# Patient Record
Sex: Female | Born: 1965 | Race: Asian | Hispanic: No | Marital: Married | Smoking: Never smoker
Health system: Southern US, Community
[De-identification: ages and names within clinical notes are randomized; demographics above are authoritative.]

---

## 1997-11-26 ENCOUNTER — Inpatient Hospital Stay (HOSPITAL_COMMUNITY): Admission: AD | Admit: 1997-11-26 | Discharge: 1997-11-28 | Payer: Self-pay | Admitting: Obstetrics and Gynecology

## 1999-12-22 ENCOUNTER — Other Ambulatory Visit: Admission: RE | Admit: 1999-12-22 | Discharge: 1999-12-22 | Payer: Self-pay | Admitting: Family Medicine

## 2001-03-25 ENCOUNTER — Other Ambulatory Visit: Admission: RE | Admit: 2001-03-25 | Discharge: 2001-03-25 | Payer: Self-pay | Admitting: *Deleted

## 2001-10-19 ENCOUNTER — Encounter: Payer: Self-pay | Admitting: Emergency Medicine

## 2001-10-19 ENCOUNTER — Emergency Department (HOSPITAL_COMMUNITY): Admission: EM | Admit: 2001-10-19 | Discharge: 2001-10-19 | Payer: Self-pay | Admitting: Emergency Medicine

## 2001-12-03 ENCOUNTER — Encounter: Admission: RE | Admit: 2001-12-03 | Discharge: 2001-12-03 | Payer: Self-pay | Admitting: Urology

## 2001-12-03 ENCOUNTER — Encounter: Payer: Self-pay | Admitting: Urology

## 2002-03-27 ENCOUNTER — Other Ambulatory Visit: Admission: RE | Admit: 2002-03-27 | Discharge: 2002-03-27 | Payer: Self-pay | Admitting: Family Medicine

## 2003-04-02 ENCOUNTER — Other Ambulatory Visit: Admission: RE | Admit: 2003-04-02 | Discharge: 2003-04-02 | Payer: Self-pay | Admitting: Family Medicine

## 2003-12-30 ENCOUNTER — Other Ambulatory Visit: Admission: RE | Admit: 2003-12-30 | Discharge: 2003-12-30 | Payer: Self-pay | Admitting: Family Medicine

## 2005-01-20 ENCOUNTER — Other Ambulatory Visit: Admission: RE | Admit: 2005-01-20 | Discharge: 2005-01-20 | Payer: Self-pay | Admitting: Family Medicine

## 2006-01-23 ENCOUNTER — Other Ambulatory Visit: Admission: RE | Admit: 2006-01-23 | Discharge: 2006-01-23 | Payer: Self-pay | Admitting: Family Medicine

## 2007-11-21 IMAGING — US MAMMO-RUNI-US
1 series · 14 of 16 positions shown · non-contrast
Comparison: NONE

CLINICAL DATA: Meyxana Tukezban RT(R)(M)(CT)   Diagnostic 
Mammogram.  

RIGHT BREAST MAMMOGRAM ADDITIONAL VIEWS AND RIGHT BREAST 
ULTRASOUND

[Series 1: us breast · 0.04mm/px · 14 of 16 slices shown]
[im 1/16]
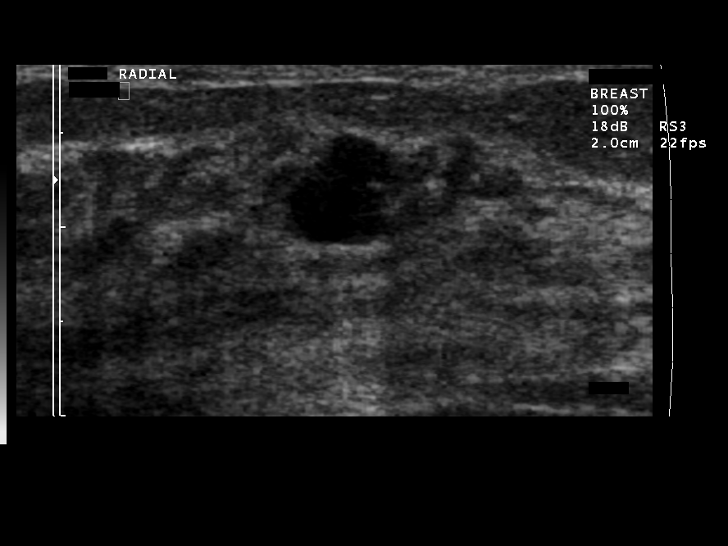
[im 2/16]
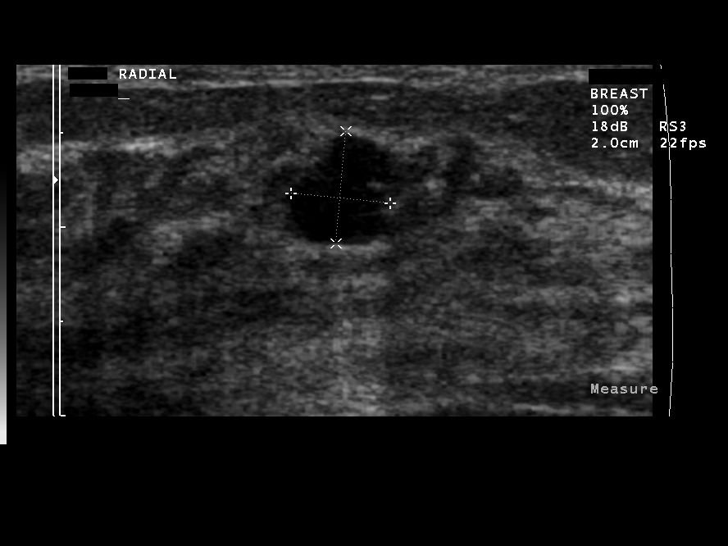
[im 3/16]
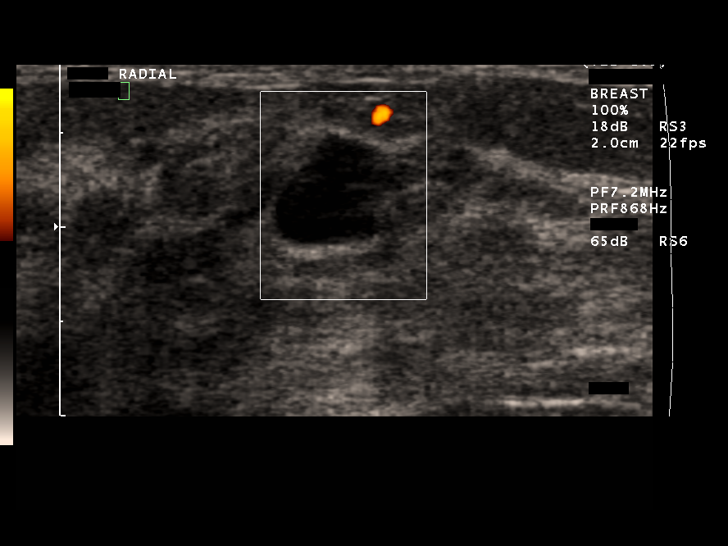
[im 5/16]
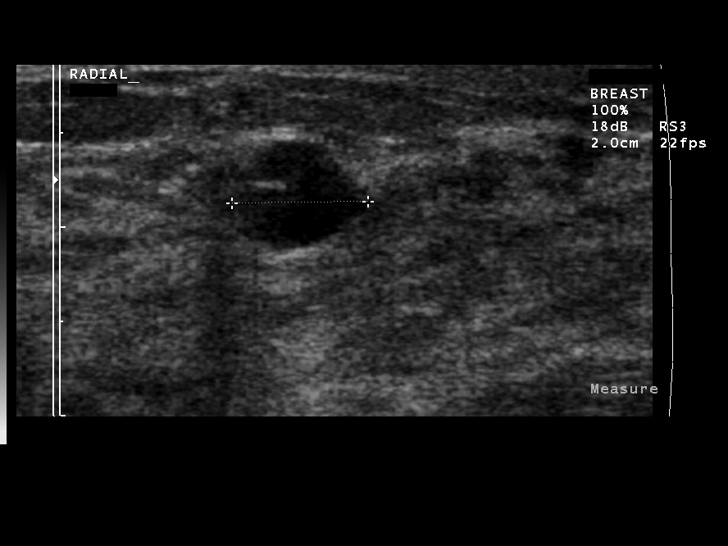
[im 6/16]
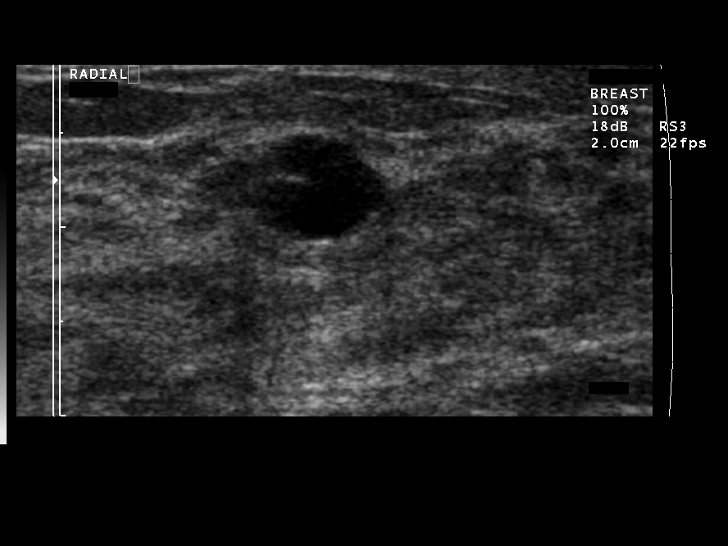
[im 7/16]
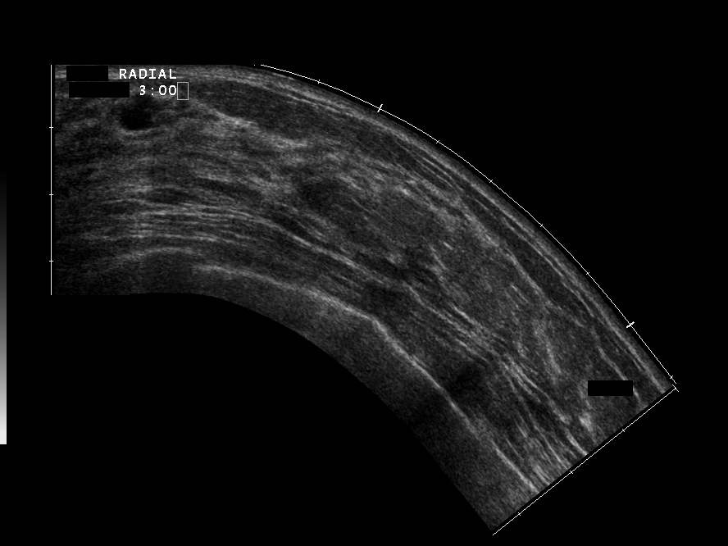
[im 8/16]
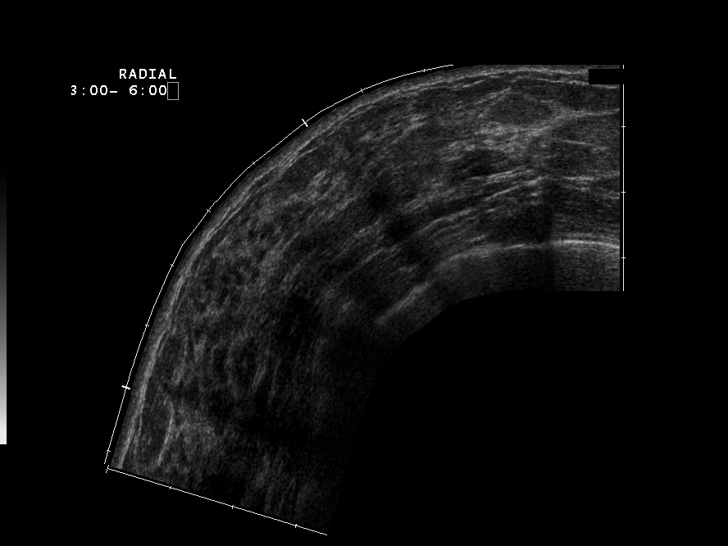
[im 9/16]
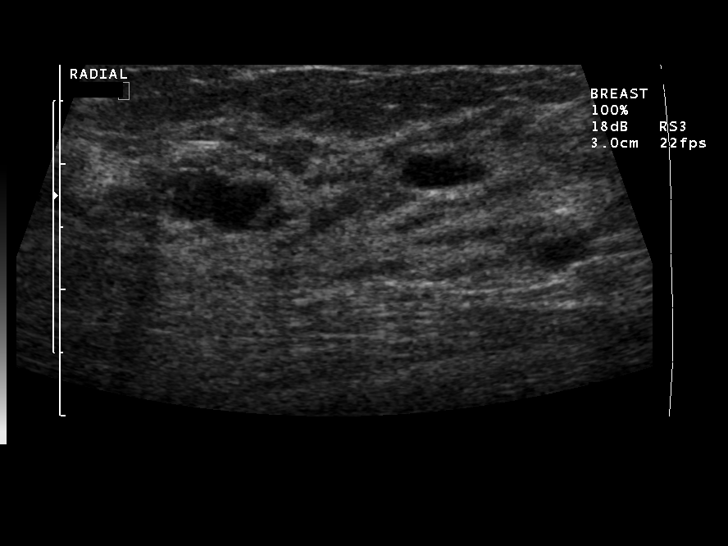
[im 10/16]
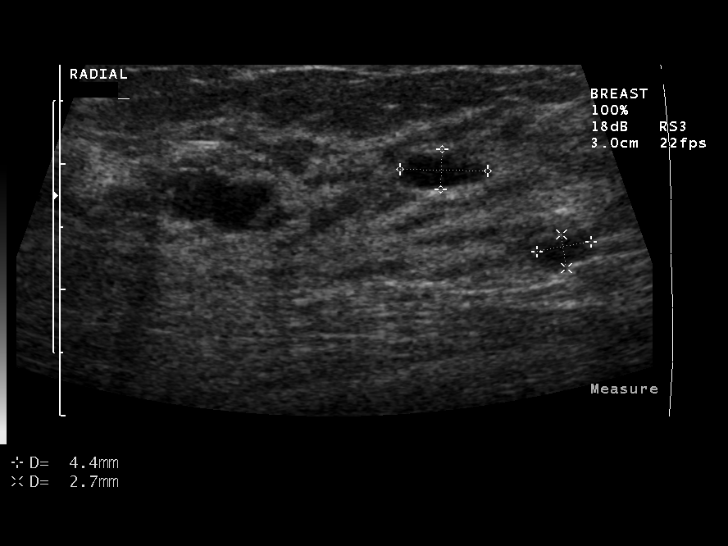
[im 11/16]
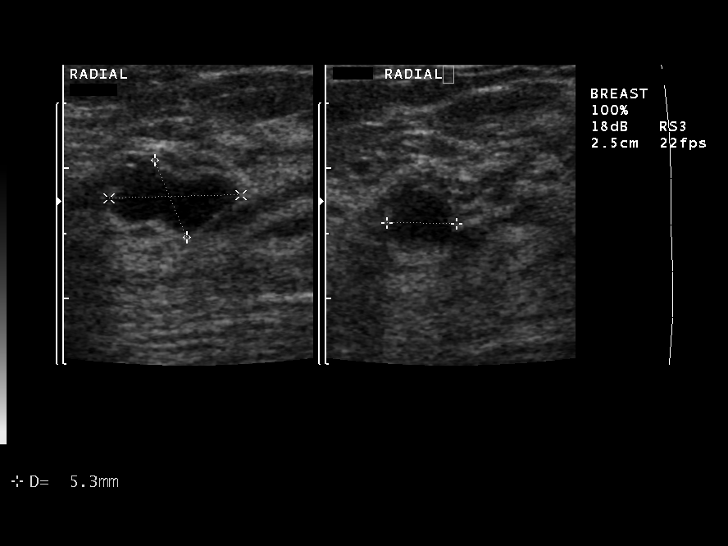
[im 13/16]
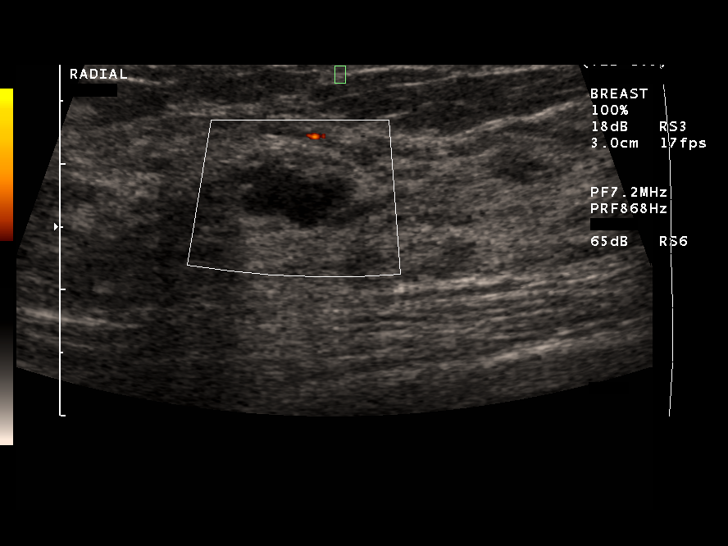
[im 14/16]
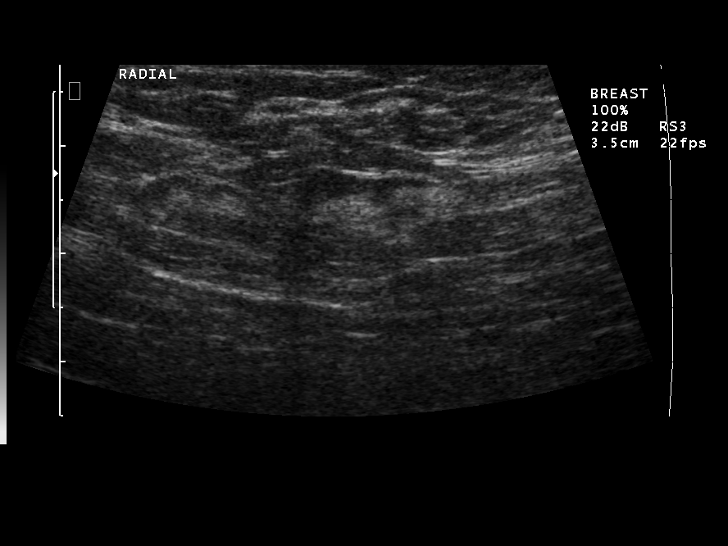
[im 15/16]
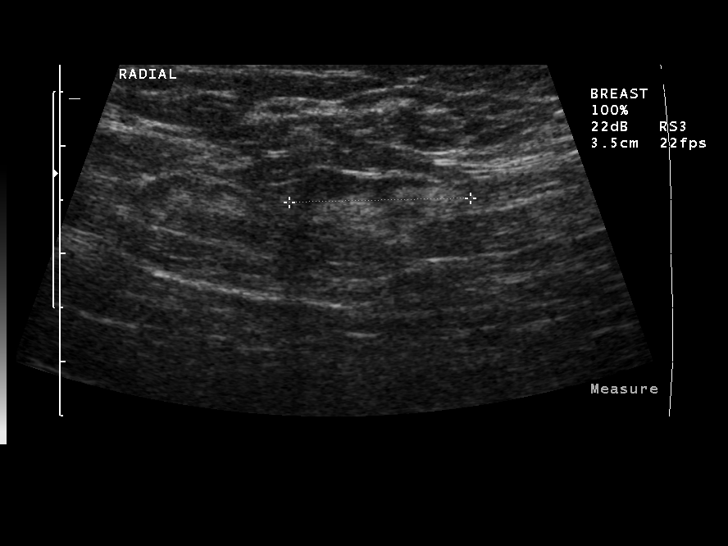
[im 16/16]
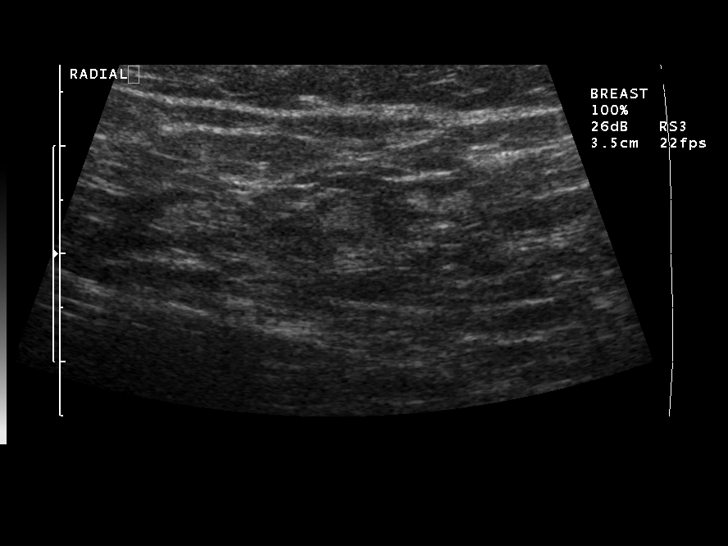

[14 of 16 positions shown; findings below may reference images not displayed]

FINDINGS: Spot compression of the right breast in the CC 
projection, exaggerated lateral right CC, and spot compression of 
the right breast in the MLO projection demonstrates benign 
fibroglandular asymmetry without definite architectural 
distortion, spiculation, mass, or microcalcification.  Ultrasound 
demonstrates benign appearing cystic changes in the [DATE] and 
[DATE] position.
IMPRESSION: Probable benign changes demonstrated 
mammographically and by ultrasound.  Recommend follow-up right 
breast mammogram and ultrasound in six-months to document 
stability. The patient was informed at the time of the examination 
of the findings and recommendations by verbal and written lay 
report. Computer assisted (Second Look) technology was used as an 
aid in interpretation of this study. BI-RADS 3 - Probably Benign 
Date: 04/04/2006  Tran Date: 04/05/2006 FELNER CEOLA

## 2008-10-08 IMAGING — US MAMMO-RUNI-US
1 series · 7 of 7 positions shown · non-contrast
Comparison: NONE

CLINICAL DATA: Jean Eloge Chartiel RT(R)(M)   Diagnostic Mammogram. 

BILATERAL MAMMOGRAM AND RIGHT BREAST ULTRASOUND

[Series 1: us breast · 0.05mm/px · 7 of 7 slices shown]
[im 1/7]
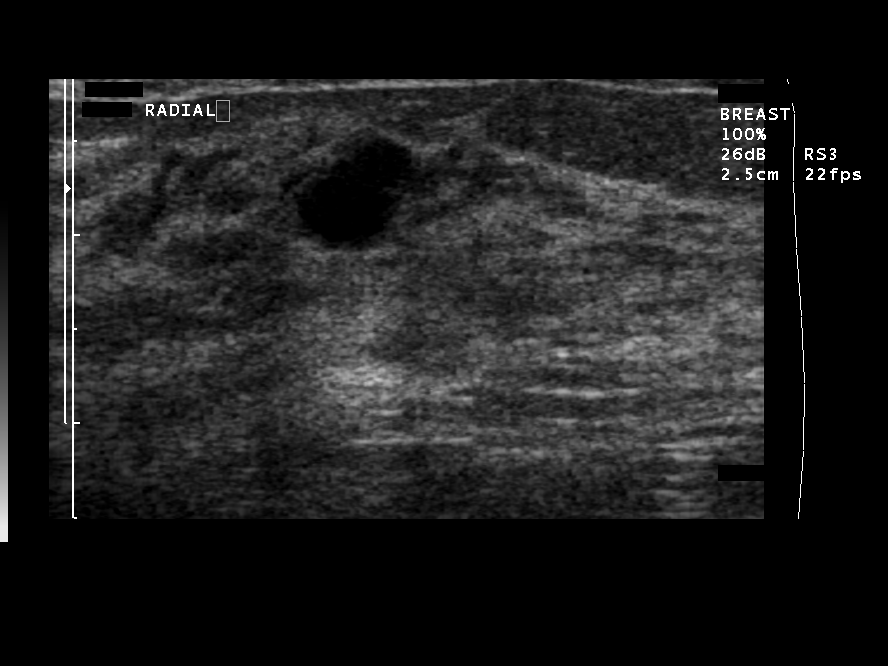
[im 2/7]
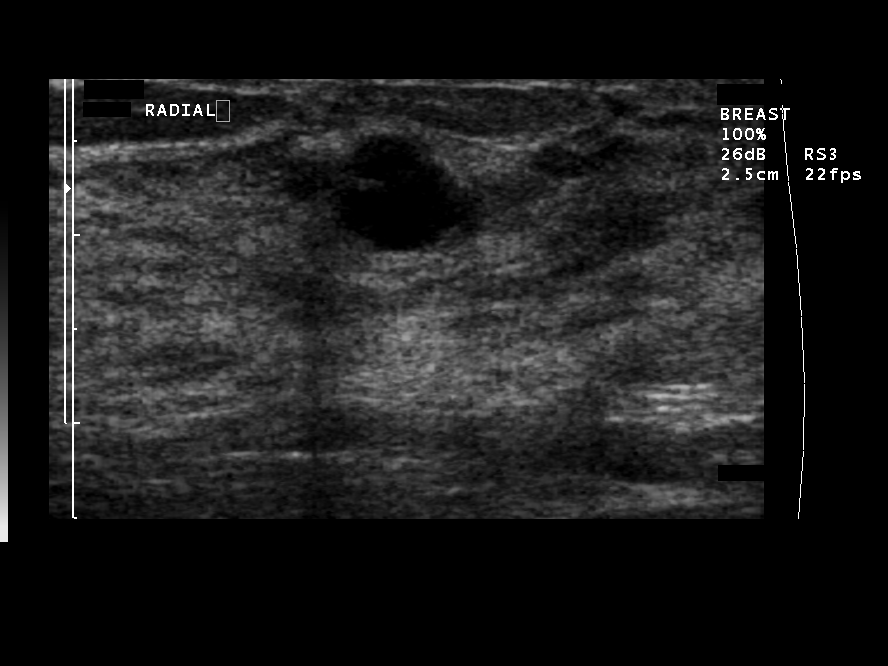
[im 3/7]
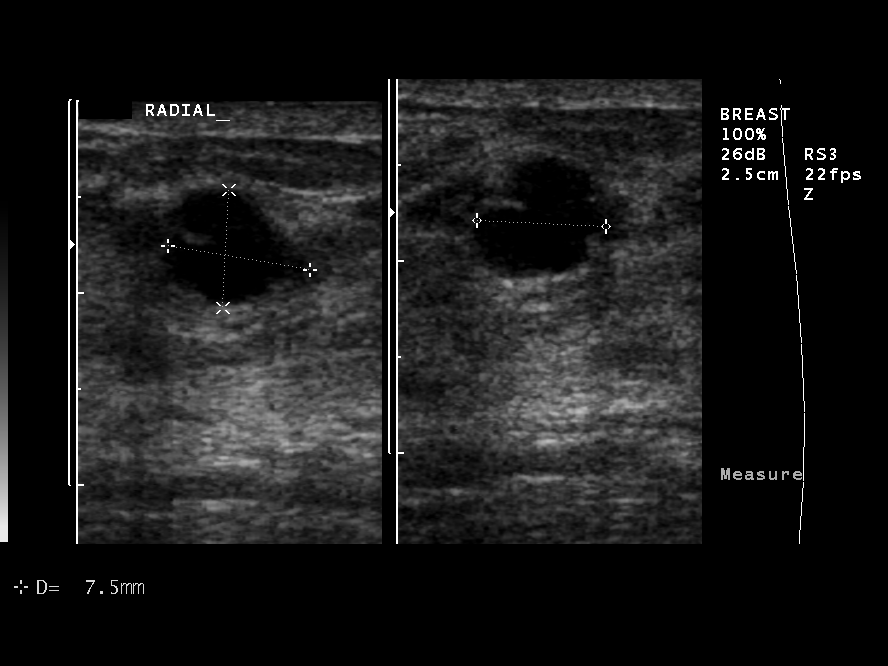
[im 4/7]
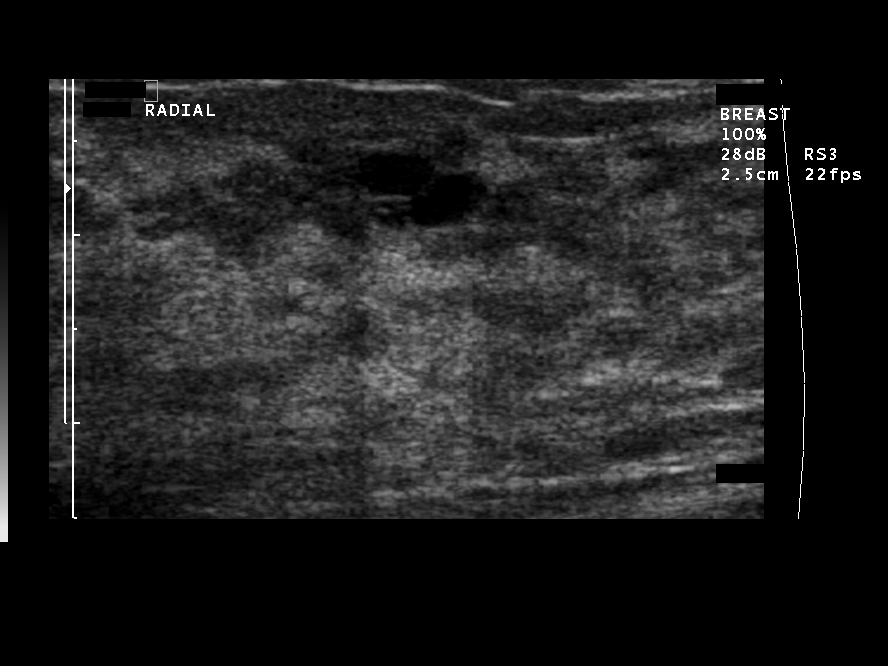
[im 5/7]
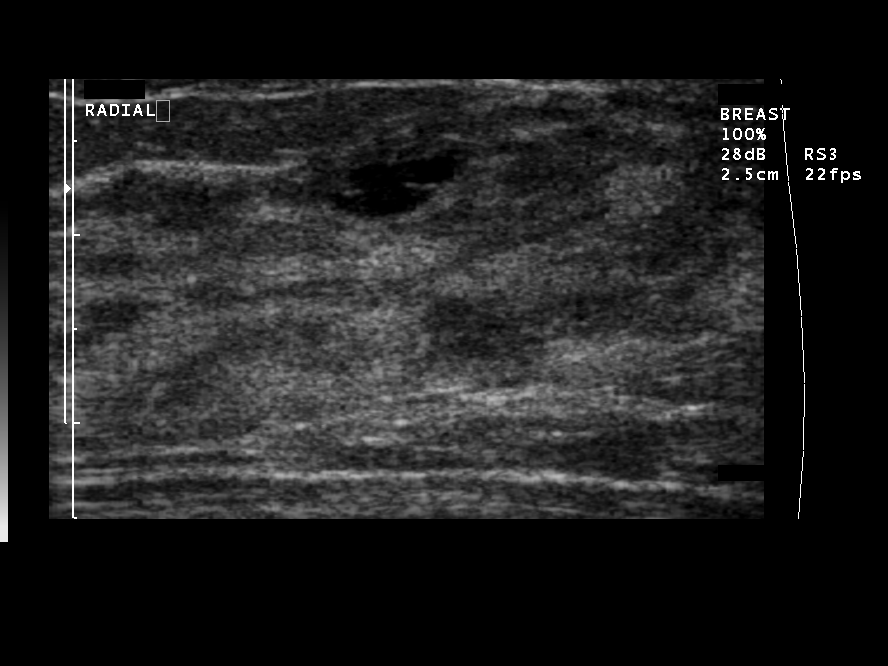
[im 6/7]
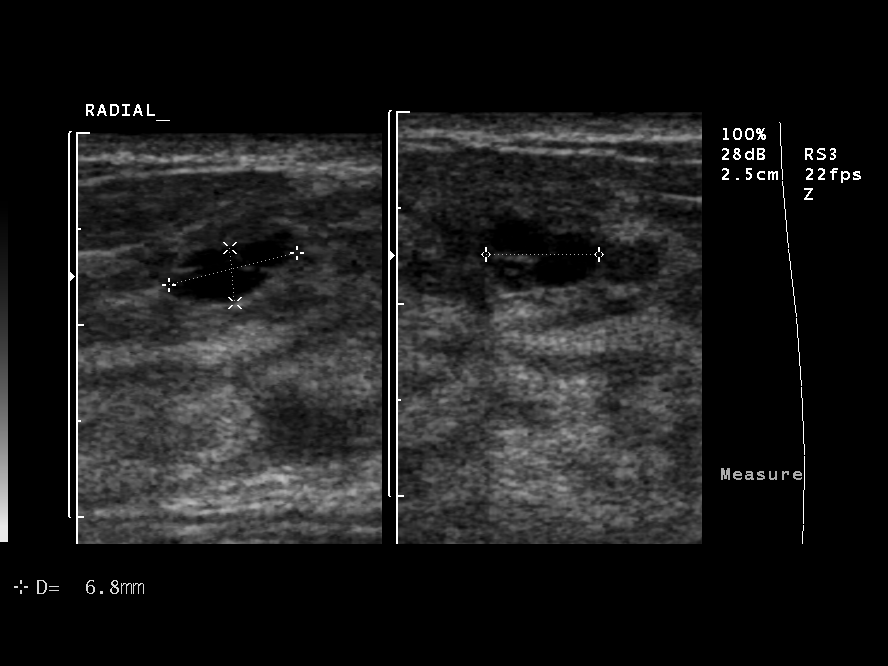
[im 7/7]
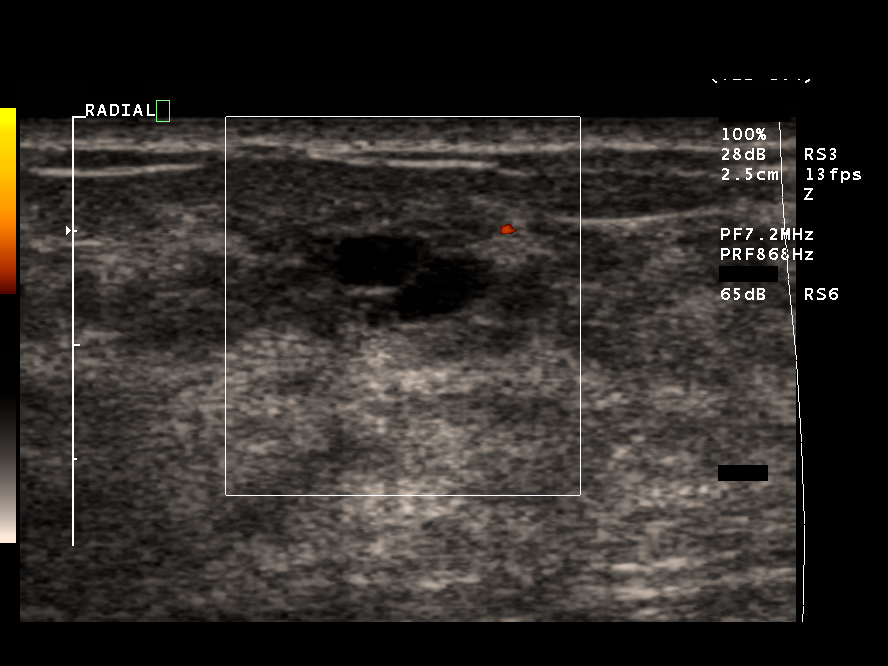

[7 of 7 positions shown; findings below may reference images not displayed]

FINDINGS: The patient was due to six-month follow-up of the 
right breast in [REDACTED] or [place]mu[place].  As the patient was close to her 
annual recheck date, bilateral mammogram was performed.  Compared 
with 06-07-01 and 03-20-06, dense glandular pattern is stable.  
There is no focal area of concern for malignancy in either breast. 
Re-evaluation of the right breast with ultrasound demonstrates a 
simple septated cyst which is stable in size and appearance.  
Compared with 04-04-06 study, this measures 0.8 x 0.6 x 0.7 cm.  
Noted in the 10 o???clock position of the right breast is a cyst 
which has diminished in size from 1.0 x 0.6 x 0.5 cm to 0.7 x
x 0.5 cm.  There are no suspicious features.
IMPRESSION: Bilateral mammogram without focal area of concern 
for malignancy. Right breast cyst identified. I have recommended 
annual screening mammography. The patient was informed at the time 
of the examination of the findings and recommendations by verbal 
and written lay report. Computer assisted (Second Look) technology 
was used as an aid in interpretation of this study. Mammogram:  
BI-RADS 1: Negative. Ultrasound:  BI-RADS 2- Benign Richard William Melo Quispe 
Gable, M.D. electronically reviewed on 02/21/2007 Dict Date: 
02/20/2007  Tran Date: 02/21/2007 CAV  F[place]ON

## 2010-03-10 ENCOUNTER — Other Ambulatory Visit
Admission: RE | Admit: 2010-03-10 | Discharge: 2010-03-10 | Payer: Self-pay | Source: Home / Self Care | Admitting: Family Medicine

## 2011-03-15 ENCOUNTER — Other Ambulatory Visit: Payer: Self-pay | Admitting: Family Medicine

## 2011-03-15 ENCOUNTER — Other Ambulatory Visit (HOSPITAL_COMMUNITY)
Admission: RE | Admit: 2011-03-15 | Discharge: 2011-03-15 | Disposition: A | Discharge status: Home / Self Care | Payer: BC Managed Care – PPO | Source: Ambulatory Visit | Attending: Family Medicine | Admitting: Family Medicine

## 2011-03-15 DIAGNOSIS — Z124 Encounter for screening for malignant neoplasm of cervix: Secondary | ICD-10-CM | POA: Insufficient documentation

## 2012-03-29 ENCOUNTER — Other Ambulatory Visit (HOSPITAL_COMMUNITY)
Admission: RE | Admit: 2012-03-29 | Discharge: 2012-03-29 | Disposition: A | Discharge status: Home / Self Care | Payer: BC Managed Care – PPO | Source: Ambulatory Visit | Attending: Family Medicine | Admitting: Family Medicine

## 2012-03-29 ENCOUNTER — Other Ambulatory Visit: Payer: Self-pay | Admitting: Family Medicine

## 2012-03-29 DIAGNOSIS — Z124 Encounter for screening for malignant neoplasm of cervix: Secondary | ICD-10-CM | POA: Insufficient documentation

## 2013-04-23 ENCOUNTER — Other Ambulatory Visit: Payer: Self-pay | Admitting: Family Medicine

## 2013-04-23 ENCOUNTER — Other Ambulatory Visit (HOSPITAL_COMMUNITY)
Admission: RE | Admit: 2013-04-23 | Discharge: 2013-04-23 | Disposition: A | Discharge status: Home / Self Care | Payer: BC Managed Care – PPO | Source: Ambulatory Visit | Attending: Family Medicine | Admitting: Family Medicine

## 2013-04-23 DIAGNOSIS — Z124 Encounter for screening for malignant neoplasm of cervix: Secondary | ICD-10-CM | POA: Insufficient documentation

## 2016-06-08 ENCOUNTER — Other Ambulatory Visit (HOSPITAL_COMMUNITY)
Admission: RE | Admit: 2016-06-08 | Discharge: 2016-06-08 | Disposition: A | Discharge status: Home / Self Care | Payer: BLUE CROSS/BLUE SHIELD | Source: Ambulatory Visit | Attending: Family Medicine | Admitting: Family Medicine

## 2016-06-08 ENCOUNTER — Other Ambulatory Visit: Payer: Self-pay | Admitting: Family Medicine

## 2016-06-08 DIAGNOSIS — E7800 Pure hypercholesterolemia, unspecified: Secondary | ICD-10-CM | POA: Diagnosis not present

## 2016-06-08 DIAGNOSIS — Z0000 Encounter for general adult medical examination without abnormal findings: Secondary | ICD-10-CM | POA: Diagnosis not present

## 2016-06-08 DIAGNOSIS — E559 Vitamin D deficiency, unspecified: Secondary | ICD-10-CM | POA: Diagnosis not present

## 2016-06-08 DIAGNOSIS — Z124 Encounter for screening for malignant neoplasm of cervix: Secondary | ICD-10-CM | POA: Insufficient documentation

## 2016-06-12 LAB — CYTOLOGY - PAP: Diagnosis: NEGATIVE

## 2016-06-19 DIAGNOSIS — D485 Neoplasm of uncertain behavior of skin: Secondary | ICD-10-CM | POA: Diagnosis not present

## 2016-06-19 DIAGNOSIS — D225 Melanocytic nevi of trunk: Secondary | ICD-10-CM | POA: Diagnosis not present

## 2016-06-19 DIAGNOSIS — L308 Other specified dermatitis: Secondary | ICD-10-CM | POA: Diagnosis not present

## 2016-07-17 ENCOUNTER — Other Ambulatory Visit: Payer: Self-pay | Admitting: Obstetrics and Gynecology

## 2016-07-17 DIAGNOSIS — N841 Polyp of cervix uteri: Secondary | ICD-10-CM | POA: Diagnosis not present

## 2016-07-17 DIAGNOSIS — Z3040 Encounter for surveillance of contraceptives, unspecified: Secondary | ICD-10-CM | POA: Diagnosis not present

## 2016-07-17 DIAGNOSIS — Z01419 Encounter for gynecological examination (general) (routine) without abnormal findings: Secondary | ICD-10-CM | POA: Diagnosis not present

## 2016-07-17 DIAGNOSIS — N644 Mastodynia: Secondary | ICD-10-CM | POA: Diagnosis not present

## 2016-08-10 DIAGNOSIS — N644 Mastodynia: Secondary | ICD-10-CM | POA: Diagnosis not present

## 2016-08-10 DIAGNOSIS — N6001 Solitary cyst of right breast: Secondary | ICD-10-CM | POA: Diagnosis not present

## 2017-03-30 DIAGNOSIS — K648 Other hemorrhoids: Secondary | ICD-10-CM | POA: Diagnosis not present

## 2017-03-30 DIAGNOSIS — Z1211 Encounter for screening for malignant neoplasm of colon: Secondary | ICD-10-CM | POA: Diagnosis not present

## 2017-06-21 DIAGNOSIS — Z0000 Encounter for general adult medical examination without abnormal findings: Secondary | ICD-10-CM | POA: Diagnosis not present

## 2017-06-21 DIAGNOSIS — M8588 Other specified disorders of bone density and structure, other site: Secondary | ICD-10-CM | POA: Diagnosis not present

## 2017-06-21 DIAGNOSIS — M25521 Pain in right elbow: Secondary | ICD-10-CM | POA: Diagnosis not present

## 2017-06-21 DIAGNOSIS — E7800 Pure hypercholesterolemia, unspecified: Secondary | ICD-10-CM | POA: Diagnosis not present

## 2017-06-21 DIAGNOSIS — E559 Vitamin D deficiency, unspecified: Secondary | ICD-10-CM | POA: Diagnosis not present

## 2017-07-19 DIAGNOSIS — Z8262 Family history of osteoporosis: Secondary | ICD-10-CM | POA: Diagnosis not present

## 2017-07-19 DIAGNOSIS — Z01419 Encounter for gynecological examination (general) (routine) without abnormal findings: Secondary | ICD-10-CM | POA: Diagnosis not present

## 2017-07-19 DIAGNOSIS — N921 Excessive and frequent menstruation with irregular cycle: Secondary | ICD-10-CM | POA: Diagnosis not present

## 2017-08-07 ENCOUNTER — Other Ambulatory Visit: Payer: Self-pay | Admitting: Obstetrics and Gynecology

## 2017-08-07 DIAGNOSIS — N921 Excessive and frequent menstruation with irregular cycle: Secondary | ICD-10-CM

## 2017-08-14 ENCOUNTER — Ambulatory Visit
Admission: RE | Admit: 2017-08-14 | Discharge: 2017-08-14 | Disposition: A | Discharge status: Home / Self Care | Payer: BLUE CROSS/BLUE SHIELD | Source: Ambulatory Visit | Attending: Obstetrics and Gynecology | Admitting: Obstetrics and Gynecology

## 2017-08-14 DIAGNOSIS — N926 Irregular menstruation, unspecified: Secondary | ICD-10-CM | POA: Diagnosis not present

## 2017-08-14 DIAGNOSIS — N921 Excessive and frequent menstruation with irregular cycle: Secondary | ICD-10-CM

## 2017-08-20 DIAGNOSIS — N921 Excessive and frequent menstruation with irregular cycle: Secondary | ICD-10-CM | POA: Diagnosis not present

## 2017-09-14 ENCOUNTER — Other Ambulatory Visit: Payer: Self-pay | Admitting: Obstetrics and Gynecology

## 2017-09-14 DIAGNOSIS — N8500 Endometrial hyperplasia, unspecified: Secondary | ICD-10-CM | POA: Diagnosis not present

## 2017-09-14 DIAGNOSIS — N921 Excessive and frequent menstruation with irregular cycle: Secondary | ICD-10-CM | POA: Diagnosis not present

## 2017-10-01 DIAGNOSIS — D259 Leiomyoma of uterus, unspecified: Secondary | ICD-10-CM | POA: Diagnosis not present

## 2017-10-01 DIAGNOSIS — N921 Excessive and frequent menstruation with irregular cycle: Secondary | ICD-10-CM | POA: Diagnosis not present

## 2017-11-16 DIAGNOSIS — Z1231 Encounter for screening mammogram for malignant neoplasm of breast: Secondary | ICD-10-CM | POA: Diagnosis not present

## 2017-11-16 DIAGNOSIS — M8589 Other specified disorders of bone density and structure, multiple sites: Secondary | ICD-10-CM | POA: Diagnosis not present

## 2018-01-02 DIAGNOSIS — N921 Excessive and frequent menstruation with irregular cycle: Secondary | ICD-10-CM | POA: Diagnosis not present

## 2018-01-02 DIAGNOSIS — D259 Leiomyoma of uterus, unspecified: Secondary | ICD-10-CM | POA: Diagnosis not present

## 2018-02-28 DIAGNOSIS — L308 Other specified dermatitis: Secondary | ICD-10-CM | POA: Diagnosis not present

## 2018-03-29 DIAGNOSIS — L814 Other melanin hyperpigmentation: Secondary | ICD-10-CM | POA: Diagnosis not present

## 2018-03-29 DIAGNOSIS — L308 Other specified dermatitis: Secondary | ICD-10-CM | POA: Diagnosis not present

## 2019-06-09 DIAGNOSIS — Z1231 Encounter for screening mammogram for malignant neoplasm of breast: Secondary | ICD-10-CM | POA: Diagnosis not present

## 2019-08-06 DIAGNOSIS — Z1321 Encounter for screening for nutritional disorder: Secondary | ICD-10-CM | POA: Diagnosis not present

## 2019-08-06 DIAGNOSIS — Z124 Encounter for screening for malignant neoplasm of cervix: Secondary | ICD-10-CM | POA: Diagnosis not present

## 2019-08-06 DIAGNOSIS — Z1151 Encounter for screening for human papillomavirus (HPV): Secondary | ICD-10-CM | POA: Diagnosis not present

## 2019-08-06 DIAGNOSIS — Z1329 Encounter for screening for other suspected endocrine disorder: Secondary | ICD-10-CM | POA: Diagnosis not present

## 2019-08-06 DIAGNOSIS — Z13220 Encounter for screening for lipoid disorders: Secondary | ICD-10-CM | POA: Diagnosis not present

## 2019-08-06 DIAGNOSIS — Z131 Encounter for screening for diabetes mellitus: Secondary | ICD-10-CM | POA: Diagnosis not present

## 2019-08-06 DIAGNOSIS — Z01419 Encounter for gynecological examination (general) (routine) without abnormal findings: Secondary | ICD-10-CM | POA: Diagnosis not present

## 2019-08-30 DIAGNOSIS — Z20828 Contact with and (suspected) exposure to other viral communicable diseases: Secondary | ICD-10-CM | POA: Diagnosis not present

## 2019-08-30 DIAGNOSIS — Z03818 Encounter for observation for suspected exposure to other biological agents ruled out: Secondary | ICD-10-CM | POA: Diagnosis not present

## 2019-09-24 DIAGNOSIS — Z13820 Encounter for screening for osteoporosis: Secondary | ICD-10-CM | POA: Diagnosis not present

## 2019-09-24 DIAGNOSIS — M85851 Other specified disorders of bone density and structure, right thigh: Secondary | ICD-10-CM | POA: Diagnosis not present

## 2019-09-24 DIAGNOSIS — M8580 Other specified disorders of bone density and structure, unspecified site: Secondary | ICD-10-CM | POA: Diagnosis not present

## 2019-09-25 DIAGNOSIS — Z713 Dietary counseling and surveillance: Secondary | ICD-10-CM | POA: Diagnosis not present

## 2019-10-14 DIAGNOSIS — E782 Mixed hyperlipidemia: Secondary | ICD-10-CM | POA: Diagnosis not present

## 2019-11-25 DIAGNOSIS — E782 Mixed hyperlipidemia: Secondary | ICD-10-CM | POA: Diagnosis not present

## 2019-12-03 DIAGNOSIS — Z9189 Other specified personal risk factors, not elsewhere classified: Secondary | ICD-10-CM | POA: Diagnosis not present

## 2019-12-03 DIAGNOSIS — Z8262 Family history of osteoporosis: Secondary | ICD-10-CM | POA: Diagnosis not present

## 2019-12-03 DIAGNOSIS — Z79899 Other long term (current) drug therapy: Secondary | ICD-10-CM | POA: Diagnosis not present

## 2020-01-13 DIAGNOSIS — E782 Mixed hyperlipidemia: Secondary | ICD-10-CM | POA: Diagnosis not present

## 2020-01-13 IMAGING — US US PELVIS COMPLETE TRANSABD/TRANSVAG
1 series · 14 of 25 positions shown · non-contrast
Comparison: None

CLINICAL DATA: Irregular menses.



[Series 1: us pelvis complete transabd/transvag · 0.22mm/px · 14 of 42 slices shown]
[im 1/42]
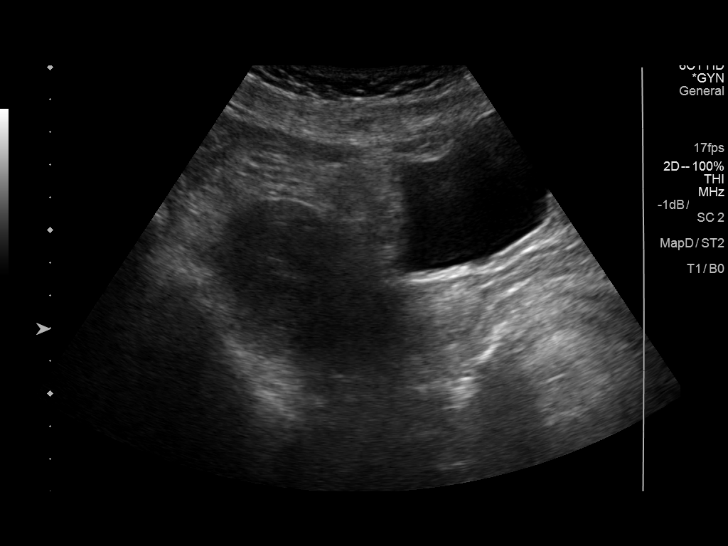
[im 4/42]
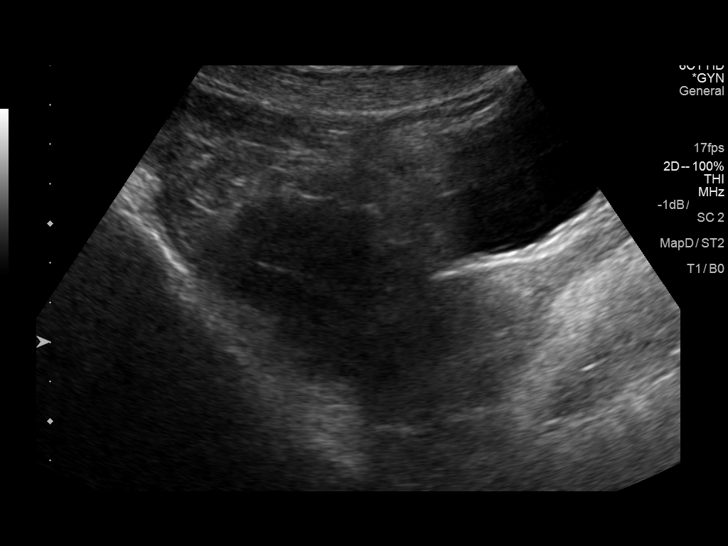
[im 7/42]
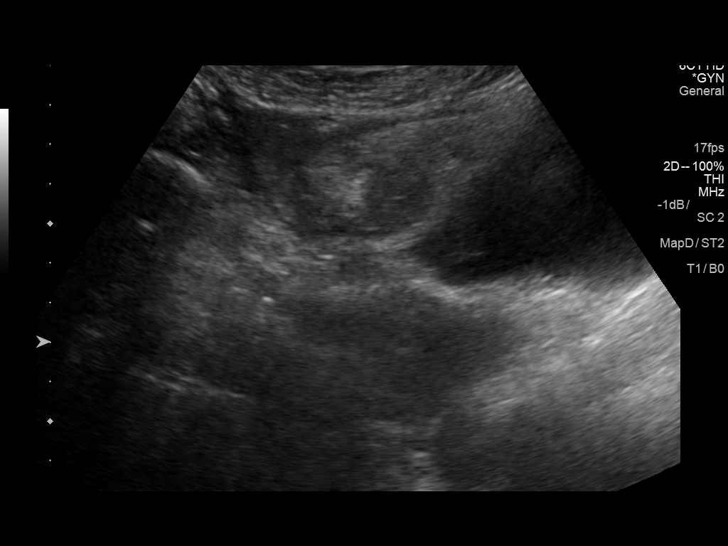
[im 11/42]
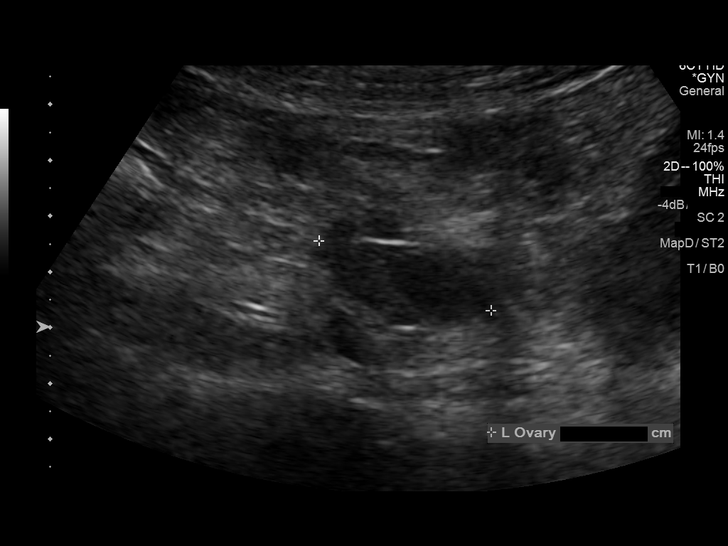
[im 14/42]
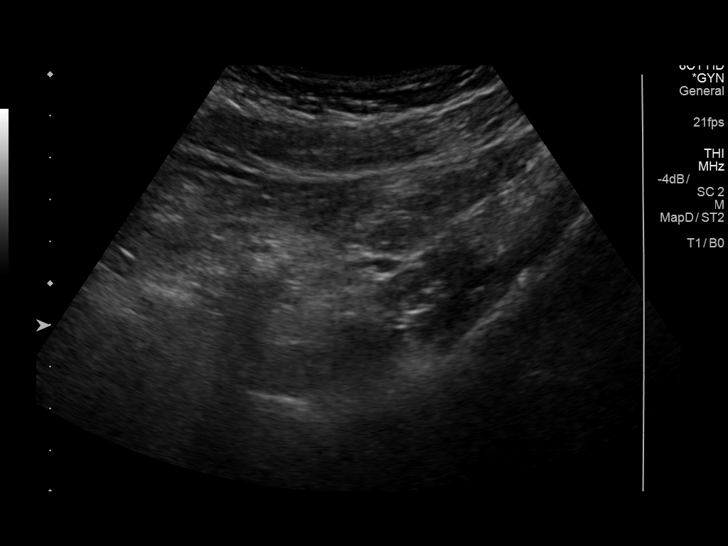
[im 16/42]
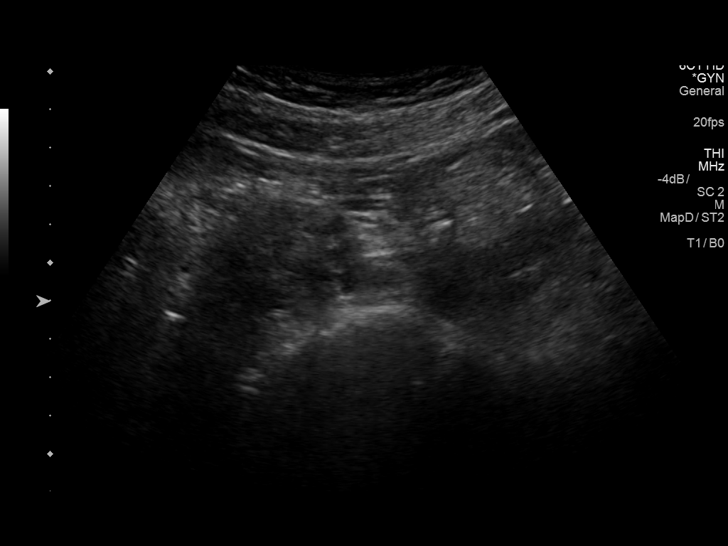
[im 19/42]
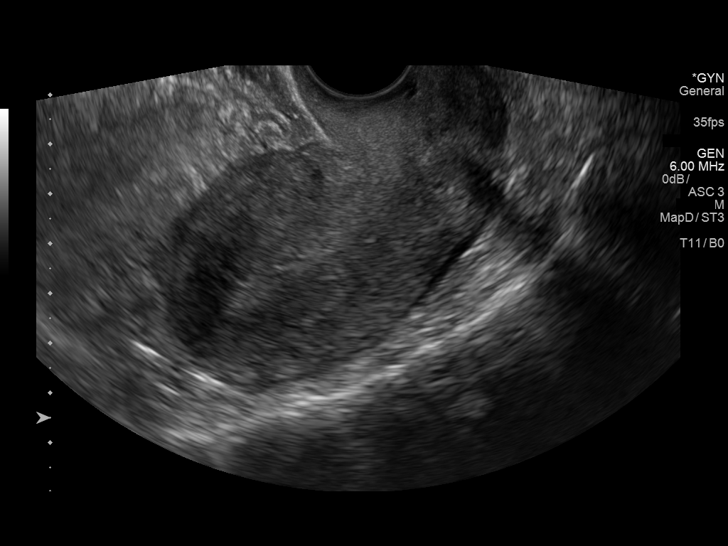
[im 23/42]
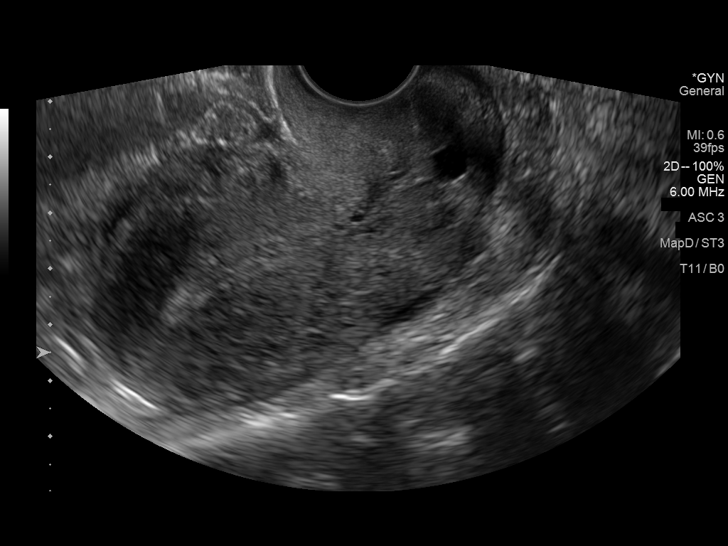
[im 26/42]
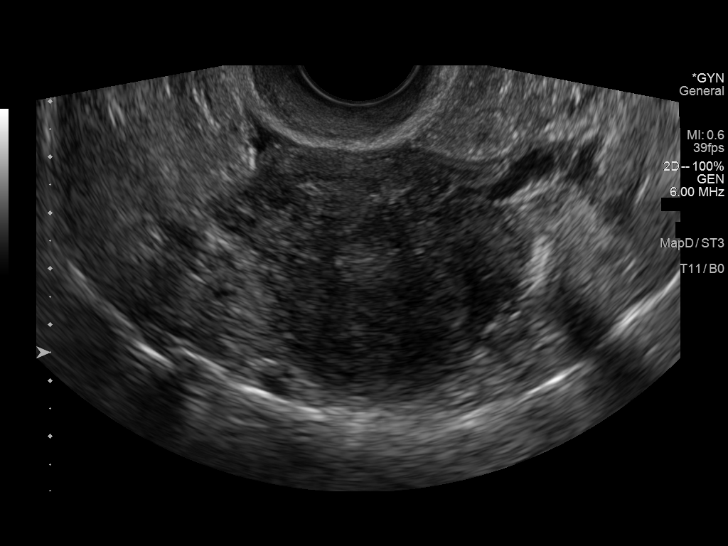
[im 28/42]
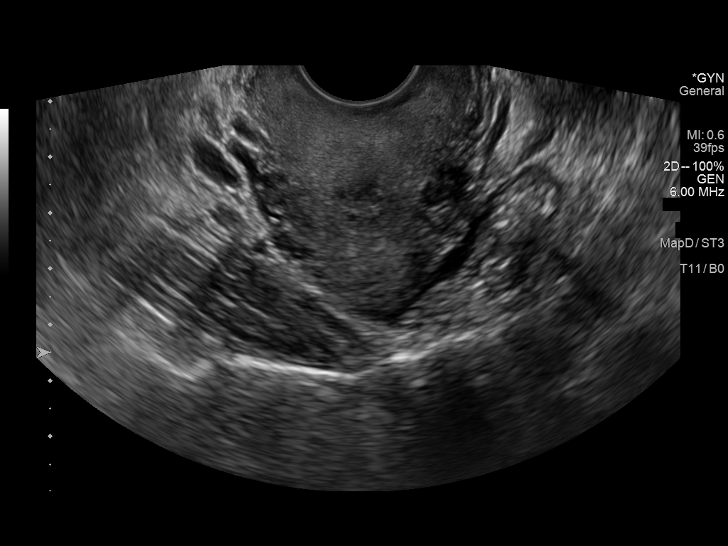
[im 31/42]
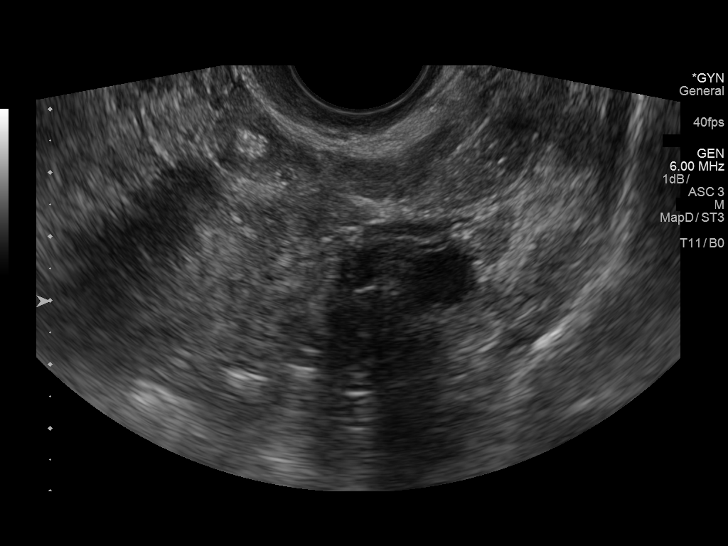
[im 35/42]
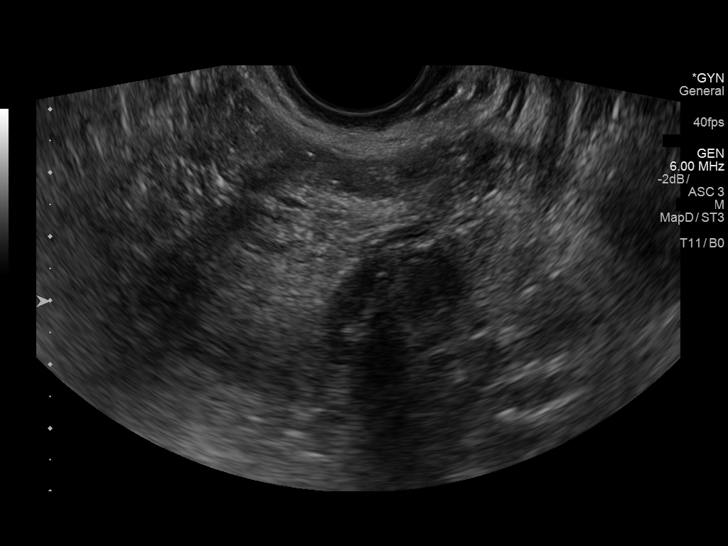
[im 38/42]
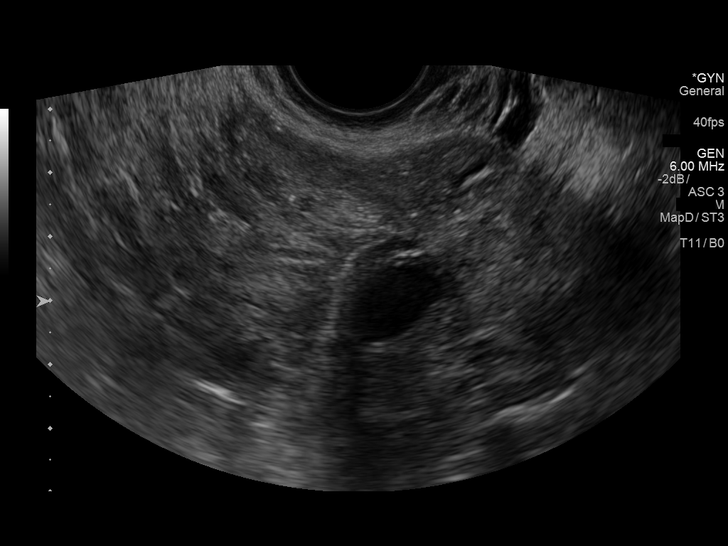
[im 42/42]
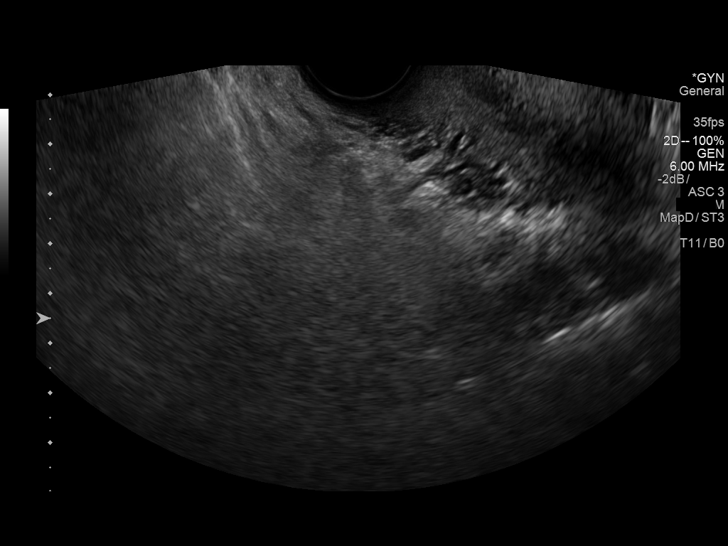

[14 of 25 positions shown; findings below may reference images not displayed]

FINDINGS: Uterus

Measurements: 9.2 x 3.7 x 4.6 cm. No fibroids or other mass
visualized.

Endometrium

Thickness: 9 mm.  No focal abnormality visualized.

Right ovary

Not visualized.

Left ovary

Measurements: 2.6 x 2.6 x 2.0 cm. Normal appearance/no adnexal mass.

Other findings

No abnormal free fluid.
IMPRESSION: Endometrium measures 9 mm. If bleeding remains unresponsive to
hormonal or medical therapy, sonohysterogram should be considered
for focal lesion work-up. (Ref: Radiological Reasoning: Algorithmic
Workup of Abnormal Vaginal Bleeding with Endovaginal Sonography and
Sonohysterography. AJR 1885; 191:S68-73)

## 2020-02-27 DIAGNOSIS — Z20822 Contact with and (suspected) exposure to covid-19: Secondary | ICD-10-CM | POA: Diagnosis not present

## 2020-02-27 DIAGNOSIS — J029 Acute pharyngitis, unspecified: Secondary | ICD-10-CM | POA: Diagnosis not present

## 2020-08-18 ENCOUNTER — Emergency Department (INDEPENDENT_AMBULATORY_CARE_PROVIDER_SITE_OTHER): Payer: BLUE CROSS/BLUE SHIELD

## 2020-08-18 ENCOUNTER — Other Ambulatory Visit: Payer: Self-pay

## 2020-08-18 ENCOUNTER — Emergency Department (INDEPENDENT_AMBULATORY_CARE_PROVIDER_SITE_OTHER)
Admission: EM | Admit: 2020-08-18 | Discharge: 2020-08-18 | Disposition: A | Discharge status: Home / Self Care | Payer: BLUE CROSS/BLUE SHIELD | Source: Home / Self Care | Attending: Family Medicine | Admitting: Family Medicine

## 2020-08-18 DIAGNOSIS — S99922A Unspecified injury of left foot, initial encounter: Secondary | ICD-10-CM | POA: Diagnosis not present

## 2020-08-18 DIAGNOSIS — S92502A Displaced unspecified fracture of left lesser toe(s), initial encounter for closed fracture: Secondary | ICD-10-CM | POA: Diagnosis not present

## 2020-08-18 DIAGNOSIS — M79675 Pain in left toe(s): Secondary | ICD-10-CM

## 2020-08-18 NOTE — Discharge Instructions (Signed)
The taping of the toes will help reduce pain.  Do this for another couple of weeks. Your toe should heal completely without any problems See your personal physician if not better in a couple weeks

## 2020-08-18 NOTE — ED Provider Notes (Signed)
[facility]    CSN: [id number] Arrival date & time: 08/18/20  1418      History   Chief Complaint Chief Complaint  Patient presents with  . Toe Injury    LT foot, 5th toe    HPI Caitlin Spencer is a 55 y.o. female.   HPI   Patient stubbed the fifth toe on her left foot about 2 weeks ago.  Still swollen and painful.  She is here requesting an x-ray.  She has pain with certain shoewear.    History reviewed. No pertinent past medical history.  There are no problems to display for this patient.   History reviewed. No pertinent surgical history.  OB History   No obstetric history on file.      Home Medications    Prior to Admission medications   Not on File    Family History History reviewed. No pertinent family history.  Social History Social History   Tobacco Use  . Smoking status: Never Smoker  . Smokeless tobacco: Never Used  Vaping Use  . Vaping Use: Never used  Substance Use Topics  . Alcohol use: Not Currently     Allergies   Patient has no known allergies.   Review of Systems Review of Systems See HPI  Physical Exam Triage Vital Signs ED Triage Vitals  Enc Vitals Group     BP 08/18/20 1446 125/82     Pulse Rate 08/18/20 1446 64     Resp 08/18/20 1446 17     Temp 08/18/20 1446 98.6 F (37 C)     Temp Source 08/18/20 1446 Oral     SpO2 08/18/20 1446 97 %     Weight --      Height --      Head Circumference --      Peak Flow --      Pain Score 08/18/20 1447 5     Pain Loc --      Pain Edu? --      Excl. in GC? --    No data found.  Updated Vital Signs BP 125/82 (BP Location: Right Arm)   Pulse 64   Temp 98.6 F (37 C) (Oral)   Resp 17   SpO2 97%      Physical Exam Constitutional:      General: She is not in acute distress.    Appearance: She is well-developed.  HENT:     Head: Normocephalic and atraumatic.  Eyes:     Conjunctiva/sclera: Conjunctivae normal.     Pupils: Pupils are equal, round, and  reactive to light.  Cardiovascular:     Rate and Rhythm: Normal rate.  Pulmonary:     Effort: Pulmonary effort is normal. No respiratory distress.  Abdominal:     General: There is no distension.     Palpations: Abdomen is soft.  Musculoskeletal:        General: Normal range of motion.     Cervical back: Normal range of motion.  Skin:    General: Skin is warm and dry.  Neurological:     Mental Status: She is alert.   The fifth toe on the left foot is swollen distally.  Pain with any movement.   UC Treatments / Results  Labs (all labs ordered are listed, but only abnormal results are displayed) Labs Reviewed - No data to display  EKG   Radiology DG Toe 5th Left  Result Date: 08/18/2020 CLINICAL DATA:  Injury 10 days ago.  Continued pain. EXAM: DG TOE 5TH LEFT COMPARISON:  None. FINDINGS: Nondisplaced fracture of the distal phalanx of the fifth toe. Mild diffuse soft tissue swelling. IMPRESSION: Nondisplaced fracture of the distal phalanx of the fifth toe, best seen on the lateral view. Electronically Signed   By: Acquanetta Belling M.D.   On: 08/18/2020 16:17    Procedures Procedures (including critical care time)  Medications Ordered in UC Medications - No data to display  Initial Impression / Assessment and Plan / UC Course  I have reviewed the triage vital signs and the nursing notes.  Pertinent labs & imaging results that were available during my care of the patient were reviewed by me and considered in my medical decision making (see chart for details).     *Explained to patient that this should heal without any limitation or difficulty.  Recommend buddy taping.  Activity limitations.  Return as needed Final Clinical Impressions(s) / UC Diagnoses   Final diagnoses:  Closed fracture of phalanx of left fifth toe, initial encounter     Discharge Instructions     The taping of the toes will help reduce pain.  Do this for another couple of weeks. Your toe should heal  completely without any problems See your personal physician if not better in a couple weeks    ED Prescriptions    None     PDMP not reviewed this encounter.   Eustace Moore, MD 08/18/20 [phone]

## 2020-08-18 NOTE — ED Triage Notes (Signed)
Pt c/o toe pain x 2 weeks. Stubbed on piece of furniture. Bruising resolved. Swelling still visible. Pain 5/10

## 2023-10-14 ENCOUNTER — Encounter: Payer: Self-pay | Admitting: Neurology

## 2023-10-14 DIAGNOSIS — B029 Zoster without complications: Secondary | ICD-10-CM | POA: Insufficient documentation

## 2023-10-14 DIAGNOSIS — M792 Neuralgia and neuritis, unspecified: Secondary | ICD-10-CM | POA: Insufficient documentation

## 2023-10-14 MED ORDER — VALACYCLOVIR HCL 1 G PO TABS: 1000.0000 mg | ORAL_TABLET | Freq: Three times a day (TID) | ORAL | 0 refills | Status: AC

## 2023-10-14 MED ORDER — DICLOFENAC SODIUM 1 % EX GEL: CUTANEOUS | 3 refills | Status: AC

## 2023-10-14 MED ORDER — GABAPENTIN 300 MG PO CAPS: 300.0000 mg | ORAL_CAPSULE | Freq: Three times a day (TID) | ORAL | 3 refills | Status: AC | PRN

## 2023-10-14 MED ORDER — LIDOCAINE-PRILOCAINE 2.5-2.5 % EX CREA: TOPICAL_CREAM | CUTANEOUS | 3 refills | Status: AC

## 2023-10-14 MED ORDER — METHYLPREDNISOLONE 4 MG PO TBPK: ORAL_TABLET | ORAL | 0 refills | Status: AC

## 2023-10-14 NOTE — Telephone Encounter (Signed)
 See patient for a week hx of right arm pit and right anterior chest area skin sensitivity, itching sensation and pain, described as achy, stinging, stabbing pain, limited range of motion of right shoulder, mild tenderness of right anterior chest, shoulder upon deep palpitation.  Skin rash, erythematous, rare small raised area, strip like involving right T2, T3 dermatone.  Most likely shingles.  Meds ordered this encounter  Medications   valACYclovir  (VALTREX ) 1000 MG tablet    Sig: Take 1 tablet (1,000 mg total) by mouth 3 (three) times daily.    Dispense:  21 tablet    Refill:  0   methylPREDNISolone  (MEDROL  DOSEPAK) 4 MG TBPK tablet    Sig: Take as instructed.    Dispense:  21 tablet    Refill:  0   gabapentin  (NEURONTIN ) 300 MG capsule    Sig: Take 1 capsule (300 mg total) by mouth 3 (three) times daily as needed.    Dispense:  90 capsule    Refill:  3   lidocaine -prilocaine  (EMLA ) cream    Sig: 1gram tid prn    Dispense:  30 g    Refill:  3   diclofenac  Sodium (VOLTAREN ) 1 % GEL    Sig: 1 gram qid prn    Dispense:  120 g    Refill:  3

## 2023-10-15 ENCOUNTER — Telehealth: Payer: Self-pay

## 2023-10-15 ENCOUNTER — Telehealth (INDEPENDENT_AMBULATORY_CARE_PROVIDER_SITE_OTHER): Admitting: Neurology

## 2023-10-15 DIAGNOSIS — B029 Zoster without complications: Secondary | ICD-10-CM

## 2023-10-15 NOTE — Progress Notes (Signed)
 ASSESSMENT AND PLAN  Caitlin Spencer is a 58 y.o. female   Right anterior chest skin discoloration, allodynia,  Most consistent with right shingle involving T1-T2,  Already improved with Valtrex  1 g 3 times a day, may add on Medrol  pack tapering dose  EMLA  gel as needed  Gabapentin  as needed.  DIAGNOSTIC DATA (LABS, IMAGING, TESTING) - I reviewed patient records, labs, notes, testing and imaging myself where available.   MEDICAL HISTORY:  Virtual Visit via video July 7th 2025:  I discussed the limitations of evaluation and management by telemedicine and the availability of in person appointments. The patient expressed understanding and agreed to proceed  Location: Provider: [facility] office; Patient: Home  I connected with Caitlin Spencer  on July 7th 2025 by a video enabled telemedicine application and verified that I am speaking with the correct person using two identifiers.  58 year old female, previously healthy, on October 05, 2023, on the way coming back from travel, she noticed right upper chest area itching, stinging sensation, gradually getting worse, later noticed erythematous patch, but there was no clear vesicle broke out, mild skin sensitivity on touch, was seen by virtual visit by PCP, given doxycycline without benefit, the pain had right upper chest armpit area gradually getting worse, limited range of motion of right shoulder, difficulty turning her neck,  Did not receive single vaccination in the past   Observations/Objective: I have reviewed problem lists, medications, allergies.  Awake, alert, oriented to history taking, casual conversation, no dysarthria, no aphasia,   REVIEW OF SYSTEMS:  Full 14 system review of systems performed and notable only for as above All other review of systems were negative.   ALLERGIES: No Known Allergies  HOME MEDICATIONS: Current Outpatient Medications  Medication Sig Dispense Refill   diclofenac  Sodium (VOLTAREN ) 1 % GEL 1 gram qid prn  120 g 3   gabapentin  (NEURONTIN ) 300 MG capsule Take 1 capsule (300 mg total) by mouth 3 (three) times daily as needed. 90 capsule 3   lidocaine -prilocaine  (EMLA ) cream 1gram tid prn 30 g 3   methylPREDNISolone  (MEDROL  DOSEPAK) 4 MG TBPK tablet Take as instructed. 21 tablet 0   valACYclovir  (VALTREX ) 1000 MG tablet Take 1 tablet (1,000 mg total) by mouth 3 (three) times daily. 21 tablet 0   No current facility-administered medications for this visit.    PAST MEDICAL HISTORY: No past medical history on file.  PAST SURGICAL HISTORY: No past surgical history on file.  FAMILY HISTORY: No family history on file.  SOCIAL HISTORY: Social History   Socioeconomic History   Marital status: Married    Spouse name: Not on file   Number of children: Not on file   Years of education: Not on file   Highest education level: Not on file  Occupational History   Not on file  Tobacco Use   Smoking status: Never   Smokeless tobacco: Never  Vaping Use   Vaping status: Never Used  Substance and Sexual Activity   Alcohol use: Not Currently   Drug use: Not on file   Sexual activity: Not on file  Other Topics Concern   Not on file  Social History Narrative   Not on file   Social Drivers of Health   Financial Resource Strain: Patient Declined (08/14/2022)   Received from [facility]   Overall Financial Resource Strain (CARDIA)    Difficulty of Paying Living Expenses: Patient declined  Food Insecurity: Patient Declined (08/14/2022)   Received from [facility]  Hunger Vital Sign    Within the past 12 months, you worried that your food would run out before you got the money to buy more.: Patient declined    Within the past 12 months, the food you bought just didn't last and you didn't have money to get more.: Patient declined  Transportation Needs: Patient Declined (08/14/2022)   Received from [facility]   PRAPARE - Transportation    Lack of Transportation (Medical): Patient declined     Lack of Transportation (Non-Medical): Patient declined  Physical Activity: Insufficiently Active (08/14/2022)   Received from [facility]   Exercise Vital Sign    On average, how many days per week do you engage in moderate to strenuous exercise (like a brisk walk)?: 2 days    On average, how many minutes do you engage in exercise at this level?: 20 min  Stress: Patient Declined (08/14/2022)   Received from [facility] - Occupational Stress Questionnaire    Feeling of Stress : Patient declined  Social Connections: Moderately Integrated (08/14/2022)   Received from [facility]   Social Network    How would you rate your social network (family, work, friends)?: Adequate participation with social networks  Intimate Partner Violence: Not At Risk (08/14/2022)   Received from [facility]   HITS    Over the last 12 months how often did your partner physically hurt you?: Never    Over the last 12 months how often did your partner insult you or talk down to you?: Never    Over the last 12 months how often did your partner threaten you with physical harm?: Never    Over the last 12 months how often did your partner scream or curse at you?: Never      Modena Callander, M.D. Ph.D.  [facility] [address] [place], [place] [address] Ph: [phone] Fax: [phone]  CC:  No referring provider defined for this encounter.  Gwenn Norris, MD (Inactive)

## 2023-10-15 NOTE — Telephone Encounter (Signed)
 Call to patient, added on VV for 12:30 today, new link sent to activate mychart
